# Patient Record
Sex: Female | Born: 2000 | Race: White | Hispanic: No | Marital: Single | Smoking: Never smoker
Health system: Southern US, Community
[De-identification: ages and names within clinical notes are randomized; demographics above are authoritative.]

## PROBLEM LIST (undated history)

## (undated) DIAGNOSIS — R102 Pelvic and perineal pain unspecified side: Secondary | ICD-10-CM

## (undated) DIAGNOSIS — N941 Unspecified dyspareunia: Secondary | ICD-10-CM

## (undated) DIAGNOSIS — N939 Abnormal uterine and vaginal bleeding, unspecified: Secondary | ICD-10-CM

## (undated) DIAGNOSIS — Z1509 Genetic susceptibility to other malignant neoplasm: Secondary | ICD-10-CM

## (undated) DIAGNOSIS — T8069XA Other serum reaction due to other serum, initial encounter: Secondary | ICD-10-CM

## (undated) DIAGNOSIS — F909 Attention-deficit hyperactivity disorder, unspecified type: Secondary | ICD-10-CM

## (undated) HISTORY — DX: Other serum reaction due to other serum, initial encounter: T80.69XA

## (undated) HISTORY — PX: TONSILLECTOMY: SUR1361

## (undated) HISTORY — DX: Attention-deficit hyperactivity disorder, unspecified type: F90.9

## (undated) HISTORY — DX: Genetic susceptibility to other malignant neoplasm: Z15.09

---
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
## 2018-02-10 DIAGNOSIS — S0990XA Unspecified injury of head, initial encounter: Secondary | ICD-10-CM | POA: Diagnosis not present

## 2018-02-10 DIAGNOSIS — R040 Epistaxis: Secondary | ICD-10-CM | POA: Diagnosis not present

## 2018-02-11 DIAGNOSIS — Z9089 Acquired absence of other organs: Secondary | ICD-10-CM | POA: Diagnosis not present

## 2018-02-11 DIAGNOSIS — R040 Epistaxis: Secondary | ICD-10-CM | POA: Diagnosis not present

## 2018-10-12 DIAGNOSIS — R05 Cough: Secondary | ICD-10-CM | POA: Diagnosis not present

## 2018-10-13 DIAGNOSIS — M67432 Ganglion, left wrist: Secondary | ICD-10-CM | POA: Diagnosis not present

## 2018-11-16 DIAGNOSIS — Z01419 Encounter for gynecological examination (general) (routine) without abnormal findings: Secondary | ICD-10-CM | POA: Diagnosis not present

## 2018-11-16 DIAGNOSIS — N920 Excessive and frequent menstruation with regular cycle: Secondary | ICD-10-CM | POA: Diagnosis not present

## 2018-11-16 DIAGNOSIS — Z6820 Body mass index (BMI) 20.0-20.9, adult: Secondary | ICD-10-CM | POA: Diagnosis not present

## 2018-11-16 DIAGNOSIS — N946 Dysmenorrhea, unspecified: Secondary | ICD-10-CM | POA: Diagnosis not present

## 2018-12-09 DIAGNOSIS — R6889 Other general symptoms and signs: Secondary | ICD-10-CM | POA: Diagnosis not present

## 2019-01-10 DIAGNOSIS — Z20828 Contact with and (suspected) exposure to other viral communicable diseases: Secondary | ICD-10-CM | POA: Diagnosis not present

## 2019-01-24 DIAGNOSIS — K2100 Gastro-esophageal reflux disease with esophagitis, without bleeding: Secondary | ICD-10-CM | POA: Diagnosis not present

## 2019-01-24 DIAGNOSIS — R0982 Postnasal drip: Secondary | ICD-10-CM | POA: Diagnosis not present

## 2019-01-26 ENCOUNTER — Other Ambulatory Visit: Payer: Self-pay

## 2019-01-26 ENCOUNTER — Emergency Department (HOSPITAL_COMMUNITY): Payer: BC Managed Care – PPO

## 2019-01-26 ENCOUNTER — Emergency Department (HOSPITAL_COMMUNITY)
Admission: EM | Admit: 2019-01-26 | Discharge: 2019-01-26 | Disposition: A | Discharge status: Home / Self Care | Payer: BC Managed Care – PPO | Attending: Emergency Medicine | Admitting: Emergency Medicine

## 2019-01-26 ENCOUNTER — Encounter (HOSPITAL_COMMUNITY): Payer: Self-pay | Admitting: Emergency Medicine

## 2019-01-26 DIAGNOSIS — R000 Tachycardia, unspecified: Secondary | ICD-10-CM | POA: Diagnosis not present

## 2019-01-26 DIAGNOSIS — M940 Chondrocostal junction syndrome [Tietze]: Secondary | ICD-10-CM | POA: Diagnosis not present

## 2019-01-26 DIAGNOSIS — Z79899 Other long term (current) drug therapy: Secondary | ICD-10-CM | POA: Diagnosis not present

## 2019-01-26 DIAGNOSIS — R079 Chest pain, unspecified: Secondary | ICD-10-CM | POA: Diagnosis not present

## 2019-01-26 LAB — BASIC METABOLIC PANEL
Anion gap: 9 (ref 5–15)
BUN: 9 mg/dL (ref 6–20)
CO2: 22 mmol/L (ref 22–32)
Calcium: 9.3 mg/dL (ref 8.9–10.3)
Chloride: 107 mmol/L (ref 98–111)
Creatinine, Ser: 0.77 mg/dL (ref 0.44–1.00)
GFR calc Af Amer: >60 mL/min (ref >60)
GFR calc non Af Amer: >60 mL/min (ref >60)
Glucose, Bld: 101 mg/dL — ABNORMAL HIGH (ref 70–99)
Potassium: 3.4 mmol/L — ABNORMAL LOW (ref 3.5–5.1)
Sodium: 138 mmol/L (ref 135–145)

## 2019-01-26 LAB — CBC
HCT: 40.7 % (ref 36.0–46.0)
Hemoglobin: 13.3 g/dL (ref 12.0–15.0)
MCH: 29.8 pg (ref 26.0–34.0)
MCHC: 32.7 g/dL (ref 30.0–36.0)
MCV: 91.3 fL (ref 80.0–100.0)
Platelets: 213 10*3/uL (ref 150–400)
RBC: 4.46 MIL/uL (ref 3.87–5.11)
RDW: 12.3 % (ref 11.5–15.5)
WBC: 9.6 10*3/uL (ref 4.0–10.5)
nRBC: 0 % (ref 0.0–0.2)

## 2019-01-26 LAB — I-STAT BETA HCG BLOOD, ED (MC, WL, AP ONLY): I-stat hCG, quantitative: <5 m[IU]/mL (ref <5)

## 2019-01-26 LAB — TROPONIN I (HIGH SENSITIVITY): Troponin I (High Sensitivity): <2 ng/L (ref <18)

## 2019-01-26 LAB — PROTIME-INR
INR: 1.1 (ref 0.8–1.2)
Prothrombin Time: 13.6 seconds (ref 11.4–15.2)

## 2019-01-26 MED ORDER — MONTELUKAST SODIUM 10 MG PO TABS: 10.0000 mg | ORAL_TABLET | Freq: Every day | ORAL | 0 refills | Status: DC

## 2019-01-26 MED ORDER — SODIUM CHLORIDE 0.9% FLUSH: 3.0000 mL | Freq: Once | INTRAVENOUS | Status: DC

## 2019-01-26 NOTE — ED Triage Notes (Signed)
Patient reports persistent central chest pain for 5 days with mild SOB , no cough or fever .

## 2019-01-26 NOTE — ED Provider Notes (Signed)
[facility] EMERGENCY DEPARTMENT Provider Note   CSN: [id number] Arrival date & time: 01/26/19  1859     History   Chief Complaint Chief Complaint  Patient presents with   Chest Pain    HPI Dana Cross is a 18 y.o. female.     18 year old female presents with complaint of chest wall pain x5 days.  Pain is located through her sternum and left and right lower ribs, worse with palpation and coughing.  Patient states that she has been coughing for the past 5 or so days, went to her PCP and was put on a PPI without improvement in her cough.  Patient followed up with ENT who tried a nasal spray which resulted in nasal congestion and no improvement in the cough.  Patient states cough is worse at night, not associated with fever or chills.  Patient has had a negative Covid test recently.  Patient also reports drinking alcohol 4 nights ago followed by multiple episodes of vomiting, unsure if this is related to her pain today.  Patient vapes, does not smoke cigarettes, no hormonal OCP use, no personal or family history of PE or DVT.  No other complaints or concerns.     Past Medical History:  Diagnosis Date   ADHD (attention deficit hyperactivity disorder)    Serum sickness     Patient Active Problem List   Diagnosis Date Noted   Serum sickness to cefdinir 02/24/2011   Tonsillar hypertrophy 02/24/2011   ADHD (attention deficit hyperactivity disorder) 01/17/2011    History reviewed. No pertinent surgical history.   OB History   No obstetric history on file.      Home Medications    Prior to Admission medications   Medication Sig Start Date End Date Taking? Authorizing Provider  amphetamine-dextroamphetamine (ADDERALL XR) 10 MG 24 hr capsule Take 1 capsule (10 mg total) by mouth every morning. 11/04/10 12/04/10  Young, Madaline Brilliant A, MD  amphetamine-dextroamphetamine (ADDERALL XR) 10 MG 24 hr capsule Take 1 capsule (10 mg total) by mouth every morning. 12/03/11    Young, Madaline Brilliant A, MD  amphetamine-dextroamphetamine (ADDERALL XR, 10MG ,) 10 MG 24 hr capsule Take 1 capsule (10 mg total) by mouth every morning. 09/19/10 10/19/10  10/21/10 A, MD  erythromycin ophthalmic ointment Apply as directed QID for 7 days or until resolved 07/02/11   07/04/11, NP  montelukast (SINGULAIR) 10 MG tablet Take 1 tablet (10 mg total) by mouth at bedtime. 01/26/19   03/28/19, PA-C    Family History No family history on file.  Social History Social History   Tobacco Use   Smoking status: Never Smoker   Smokeless tobacco: Never Used  Substance Use Topics   Alcohol use: Yes    Frequency: Never   Drug use: Never     Allergies   Cefdinir   Review of Systems Review of Systems  Constitutional: Negative for chills, diaphoresis and fever.  HENT: Positive for congestion and postnasal drip.   Respiratory: Positive for cough. Negative for chest tightness and shortness of breath.   Cardiovascular: Positive for chest pain. Negative for palpitations and leg swelling.  Gastrointestinal: Negative for abdominal pain.  Musculoskeletal: Negative for arthralgias and myalgias.  Skin: Negative for rash and wound.  Allergic/Immunologic: Negative for immunocompromised state.  Neurological: Negative for weakness.  Hematological: Negative for adenopathy.  Psychiatric/Behavioral: Negative for confusion.  All other systems reviewed and are negative.    Physical Exam Updated Vital Signs BP 112/81  Pulse 96    Temp 98.6 F (37 C) (Oral)    Resp 16    LMP 12/25/2018 (Approximate)    SpO2 100%   Physical Exam Vitals signs and nursing note reviewed.  Constitutional:      General: She is not in acute distress.    Appearance: She is well-developed. She is not diaphoretic.  HENT:     Head: Normocephalic and atraumatic.  Neck:     Musculoskeletal: Neck supple.  Cardiovascular:     Rate and Rhythm: Normal rate and regular rhythm.     Heart sounds: Normal  heart sounds. No murmur.  Pulmonary:     Effort: Pulmonary effort is normal.     Breath sounds: Normal breath sounds. No decreased breath sounds.  Chest:     Chest wall: Tenderness present. No deformity or crepitus.    Abdominal:     Palpations: Abdomen is soft.     Tenderness: There is no abdominal tenderness.  Musculoskeletal:     Right lower leg: No edema.     Left lower leg: No edema.  Neurological:     Mental Status: She is alert and oriented to person, place, and time.  Psychiatric:        Behavior: Behavior normal.      ED Treatments / Results  Labs (all labs ordered are listed, but only abnormal results are displayed) Labs Reviewed  BASIC METABOLIC PANEL - Abnormal; Notable for the following components:      Result Value   Potassium 3.4 (*)    Glucose, Bld 101 (*)    All other components within normal limits  CBC  PROTIME-INR  I-STAT BETA HCG BLOOD, ED ([facility], [facility], [facility] ONLY)  TROPONIN I (HIGH SENSITIVITY)    EKG EKG Interpretation  Date/Time:  Wednesday January 26 2019 19:11:15 EDT Ventricular Rate:  101 PR Interval:  138 QRS Duration: 78 QT Interval:  338 QTC Calculation: 438 R Axis:   49 Text Interpretation:  Sinus tachycardia T wave abnormality, consider inferior ischemia Abnormal ECG No previous ECGs available Confirmed by Richardean CanalYao, David H [phone]([id number]) on 01/26/2019 8:30:38 PM   Radiology Dg Chest 2 View  Result Date: 01/26/2019 CLINICAL DATA:  Chest pain EXAM: CHEST - 2 VIEW COMPARISON:  None. FINDINGS: The heart size and mediastinal contours are within normal limits. Both lungs are clear. The visualized skeletal structures are unremarkable. IMPRESSION: No acute cardiopulmonary process. Electronically Signed   By: Jonna ClarkBindu  Avutu M.D.   On: 01/26/2019 20:02    Procedures Procedures (including critical care time)  Medications Ordered in ED Medications  sodium chloride flush (NS) 0.9 % injection 3 mL (has no administration in time range)     Initial  Impression / Assessment and Plan / ED Course  I have reviewed the triage vital signs and the nursing notes.  Pertinent labs & imaging results that were available during my care of the patient were reviewed by me and considered in my medical decision making (see chart for details).  Clinical Course as of Jan 25 2099  Wed Jan 26, 2019  [id number] 18 year old female presents with complaint of chest pain with palpation through the area of her sternum, occasionally in the area of her left and right lower ribs.  No fevers, no specific recent illness however does report frequent cough which has been treated recently with a PPI and nasal spray without improvement.  On exam patient has tenderness across her sternum, palpation reproduces her pain.  No crepitus, no rashes,  no ecchymosis.  Abdomen is soft and nontender.  EKG reviewed with Dr. Darl Householder, ER attending, unremarkable.  Troponin is negative, hCG negative, INR within normal limits, CBC and BMP without significant findings.  Chest x-ray is clear.  PERC negative Discussed likely costochondritis, recommend treatment with NSAID therapy, given further instructions on cold and warm compresses and recommend follow-up with PCP.  Regarding patient's cough, discussed tic versus asthma causing nighttime cough.  No history of asthma, patient will try Singulair as she has had no relief with her nasal spray and PPI.  Patient to continue on PPI throughout her course of NSAID therapy for costochondritis and follow-up with PCP.   [LM]    Clinical Course User Index [LM] Tacy Learn, PA-C      Final Clinical Impressions(s) / ED Diagnoses   Final diagnoses:  Costochondritis, acute    ED Discharge Orders         Ordered    montelukast (SINGULAIR) 10 MG tablet  Daily at bedtime     01/26/19 2034           Tacy Learn, PA-C 01/26/19 2100    Drenda Freeze, MD 01/26/19 2342

## 2019-01-26 NOTE — Discharge Instructions (Addendum)
Take 600mg  Motrin every 8 hours for 10 days. Take Singulair at bedtime. Discontinue if no improvement in your cough after 1 week. Follow up with your primary care provider.

## 2019-01-26 NOTE — ED Notes (Signed)
Discharge instructions discussed with pt. Pt verbalized understanding. Pt stable and ambulatory. No signature pad available. 

## 2019-02-14 DIAGNOSIS — Z20828 Contact with and (suspected) exposure to other viral communicable diseases: Secondary | ICD-10-CM | POA: Diagnosis not present

## 2019-02-17 ENCOUNTER — Other Ambulatory Visit: Payer: Self-pay | Admitting: Gastroenterology

## 2019-02-17 DIAGNOSIS — R079 Chest pain, unspecified: Secondary | ICD-10-CM

## 2019-02-17 DIAGNOSIS — R109 Unspecified abdominal pain: Secondary | ICD-10-CM

## 2019-02-17 DIAGNOSIS — R0789 Other chest pain: Secondary | ICD-10-CM | POA: Diagnosis not present

## 2019-02-17 DIAGNOSIS — K2100 Gastro-esophageal reflux disease with esophagitis, without bleeding: Secondary | ICD-10-CM | POA: Diagnosis not present

## 2019-02-25 ENCOUNTER — Other Ambulatory Visit: Payer: BC Managed Care – PPO

## 2019-03-01 ENCOUNTER — Ambulatory Visit
Admission: RE | Admit: 2019-03-01 | Discharge: 2019-03-01 | Disposition: A | Discharge status: Home / Self Care | Payer: BC Managed Care – PPO | Source: Ambulatory Visit | Attending: Gastroenterology | Admitting: Gastroenterology

## 2019-03-01 DIAGNOSIS — R109 Unspecified abdominal pain: Secondary | ICD-10-CM

## 2019-03-01 DIAGNOSIS — R079 Chest pain, unspecified: Secondary | ICD-10-CM | POA: Diagnosis not present

## 2019-03-25 DIAGNOSIS — L298 Other pruritus: Secondary | ICD-10-CM | POA: Diagnosis not present

## 2019-03-25 DIAGNOSIS — N390 Urinary tract infection, site not specified: Secondary | ICD-10-CM | POA: Diagnosis not present

## 2019-04-16 DIAGNOSIS — Z03818 Encounter for observation for suspected exposure to other biological agents ruled out: Secondary | ICD-10-CM | POA: Diagnosis not present

## 2019-04-16 DIAGNOSIS — R05 Cough: Secondary | ICD-10-CM | POA: Diagnosis not present

## 2019-04-16 DIAGNOSIS — R5383 Other fatigue: Secondary | ICD-10-CM | POA: Diagnosis not present

## 2019-04-17 DIAGNOSIS — Z03818 Encounter for observation for suspected exposure to other biological agents ruled out: Secondary | ICD-10-CM | POA: Diagnosis not present

## 2019-06-14 DIAGNOSIS — Z20828 Contact with and (suspected) exposure to other viral communicable diseases: Secondary | ICD-10-CM | POA: Diagnosis not present

## 2019-07-30 DIAGNOSIS — R05 Cough: Secondary | ICD-10-CM | POA: Diagnosis not present

## 2019-09-01 DIAGNOSIS — N644 Mastodynia: Secondary | ICD-10-CM | POA: Diagnosis not present

## 2019-09-15 DIAGNOSIS — N644 Mastodynia: Secondary | ICD-10-CM | POA: Diagnosis not present

## 2019-11-17 DIAGNOSIS — Z0000 Encounter for general adult medical examination without abnormal findings: Secondary | ICD-10-CM | POA: Diagnosis not present

## 2019-11-17 DIAGNOSIS — N920 Excessive and frequent menstruation with regular cycle: Secondary | ICD-10-CM | POA: Diagnosis not present

## 2019-11-17 DIAGNOSIS — N644 Mastodynia: Secondary | ICD-10-CM | POA: Diagnosis not present

## 2019-11-17 DIAGNOSIS — Z124 Encounter for screening for malignant neoplasm of cervix: Secondary | ICD-10-CM | POA: Diagnosis not present

## 2020-01-03 DIAGNOSIS — S93491A Sprain of other ligament of right ankle, initial encounter: Secondary | ICD-10-CM | POA: Diagnosis not present

## 2020-01-23 DIAGNOSIS — R509 Fever, unspecified: Secondary | ICD-10-CM | POA: Diagnosis not present

## 2020-01-24 DIAGNOSIS — J029 Acute pharyngitis, unspecified: Secondary | ICD-10-CM | POA: Diagnosis not present

## 2020-02-01 DIAGNOSIS — J069 Acute upper respiratory infection, unspecified: Secondary | ICD-10-CM | POA: Diagnosis not present

## 2020-05-22 DIAGNOSIS — M67432 Ganglion, left wrist: Secondary | ICD-10-CM | POA: Diagnosis not present

## 2020-06-01 DIAGNOSIS — M67432 Ganglion, left wrist: Secondary | ICD-10-CM | POA: Diagnosis not present

## 2020-06-01 DIAGNOSIS — G8918 Other acute postprocedural pain: Secondary | ICD-10-CM | POA: Diagnosis not present

## 2020-06-14 DIAGNOSIS — M67432 Ganglion, left wrist: Secondary | ICD-10-CM | POA: Diagnosis not present

## 2020-09-19 ENCOUNTER — Ambulatory Visit (HOSPITAL_COMMUNITY)
Admission: RE | Admit: 2020-09-19 | Discharge: 2020-09-19 | Disposition: A | Discharge status: Home / Self Care | Payer: 59 | Source: Ambulatory Visit | Attending: Internal Medicine | Admitting: Internal Medicine

## 2020-09-19 ENCOUNTER — Other Ambulatory Visit: Payer: Self-pay

## 2020-09-19 ENCOUNTER — Encounter (HOSPITAL_COMMUNITY): Payer: Self-pay

## 2020-09-19 VITALS — BP 109/70 | HR 89 | Temp 98.2°F | Resp 16

## 2020-09-19 DIAGNOSIS — B373 Candidiasis of vulva and vagina: Secondary | ICD-10-CM | POA: Diagnosis not present

## 2020-09-19 DIAGNOSIS — B3731 Acute candidiasis of vulva and vagina: Secondary | ICD-10-CM

## 2020-09-19 LAB — POCT URINALYSIS DIPSTICK, ED / UC
Bilirubin Urine: NEGATIVE
Glucose, UA: NEGATIVE mg/dL
Ketones, ur: NEGATIVE mg/dL
Leukocytes,Ua: NEGATIVE
Nitrite: NEGATIVE
Protein, ur: NEGATIVE mg/dL
Specific Gravity, Urine: 1.015 (ref 1.005–1.030)
Urobilinogen, UA: 0.2 mg/dL (ref 0.0–1.0)
pH: 6 (ref 5.0–8.0)

## 2020-09-19 LAB — POC URINE PREG, ED: Preg Test, Ur: NEGATIVE

## 2020-09-19 MED ORDER — FLUCONAZOLE 150 MG PO TABS: 150.0000 mg | ORAL_TABLET | Freq: Every day | ORAL | 0 refills | Status: DC

## 2020-09-19 NOTE — Discharge Instructions (Signed)
Your urine did not show any signs of infection.    Take one Diflucan today and repeat in 3 days if you are still having symptoms.   Try to stay clean and dry.  Avoid tight fitting clothing or synthetic fibers that do not allow your skin to breathe.    Return or go to the Emergency Department if symptoms worsen or do not improve in the next few days.

## 2020-09-19 NOTE — ED Triage Notes (Signed)
Vaginal pain and itching for 4 days.  Labia swelling yesterday that was very uncomfortable.

## 2020-09-19 NOTE — ED Provider Notes (Signed)
[facility]    CSN: [id number] Arrival date & time: 09/19/20  1726      History   Chief Complaint Chief Complaint  Patient presents with  . Vaginal Itching    HPI Dana Cross is a 20 y.o. female.   Patient here for evaluation of vaginal itching and discharge for the past few days, worse yesterday.  Reports having some right labial swelling and burning with urination yesterday.  Reports having some thick white discharge.  Denies any odor.  Denies any recent sexual contact and no concerns about STIs at this time.  Denies any trauma, injury, or other precipitating event.  Denies any specific alleviating or aggravating factors.  Denies any fevers, chest pain, shortness of breath, N/V/D, numbness, tingling, weakness, abdominal pain, or headaches.    The history is provided by the patient.  Vaginal Itching    Past Medical History:  Diagnosis Date  . ADHD (attention deficit hyperactivity disorder)   . Serum sickness     Patient Active Problem List   Diagnosis Date Noted  . Serum sickness to cefdinir 02/24/2011  . Tonsillar hypertrophy 02/24/2011  . ADHD (attention deficit hyperactivity disorder) 01/17/2011    History reviewed. No pertinent surgical history.  OB History   No obstetric history on file.      Home Medications    Prior to Admission medications   Medication Sig Start Date End Date Taking? Authorizing Provider  fluconazole (DIFLUCAN) 150 MG tablet Take 1 tablet (150 mg total) by mouth daily. Take one tablet now and one in 3 days if you are still having symptoms 09/19/20  Yes Ivette Loyal, NP  amphetamine-dextroamphetamine (ADDERALL XR) 10 MG 24 hr capsule Take 1 capsule (10 mg total) by mouth every morning. 11/04/10 12/04/10  Young, Madaline Brilliant A, MD  amphetamine-dextroamphetamine (ADDERALL XR) 10 MG 24 hr capsule Take 1 capsule (10 mg total) by mouth every morning. 12/03/11   Young, Madaline Brilliant A, MD  amphetamine-dextroamphetamine (ADDERALL XR, 10MG ,) 10 MG 24  hr capsule Take 1 capsule (10 mg total) by mouth every morning. 09/19/10 10/19/10  10/21/10 A, MD  erythromycin ophthalmic ointment Apply as directed QID for 7 days or until resolved 07/02/11   07/04/11, NP  montelukast (SINGULAIR) 10 MG tablet Take 1 tablet (10 mg total) by mouth at bedtime. 01/26/19   03/28/19, PA-C    Family History No family history on file.  Social History Social History   Tobacco Use  . Smoking status: Never Smoker  . Smokeless tobacco: Never Used  Substance Use Topics  . Alcohol use: Yes  . Drug use: Never     Allergies   Cefdinir   Review of Systems Review of Systems  Genitourinary: Positive for vaginal discharge and vaginal pain.  All other systems reviewed and are negative.    Physical Exam Triage Vital Signs ED Triage Vitals  Enc Vitals Group     BP 09/19/20 1749 109/70     Pulse Rate 09/19/20 1749 89     Resp 09/19/20 1749 16     Temp 09/19/20 1749 98.2 F (36.8 C)     Temp Source 09/19/20 1749 Oral     SpO2 09/19/20 1749 100 %     Weight --      Height --      Head Circumference --      Peak Flow --      Pain Score 09/19/20 1747 5     Pain Loc --  Pain Edu? --      Excl. in GC? --    No data found.  Updated Vital Signs BP 109/70   Pulse 89   Temp 98.2 F (36.8 C) (Oral)   Resp 16   LMP 08/24/2020   SpO2 100%   Visual Acuity Right Eye Distance:   Left Eye Distance:   Bilateral Distance:    Right Eye Near:   Left Eye Near:    Bilateral Near:     Physical Exam Vitals and nursing note reviewed.  Constitutional:      General: She is not in acute distress.    Appearance: Normal appearance. She is not ill-appearing, toxic-appearing or diaphoretic.  HENT:     Head: Normocephalic and atraumatic.  Eyes:     Conjunctiva/sclera: Conjunctivae normal.  Cardiovascular:     Rate and Rhythm: Normal rate.     Pulses: Normal pulses.  Pulmonary:     Effort: Pulmonary effort is normal.  Abdominal:      General: Abdomen is flat.  Genitourinary:    Comments: declines Musculoskeletal:        General: Normal range of motion.     Cervical back: Normal range of motion.  Skin:    General: Skin is warm and dry.  Neurological:     General: No focal deficit present.     Mental Status: She is alert and oriented to person, place, and time.  Psychiatric:        Mood and Affect: Mood normal.      UC Treatments / Results  Labs (all labs ordered are listed, but only abnormal results are displayed) Labs Reviewed  POCT URINALYSIS DIPSTICK, ED / UC - Abnormal; Notable for the following components:      Result Value   Hgb urine dipstick TRACE (*)    All other components within normal limits  POC URINE PREG, ED    EKG   Radiology No results found.  Procedures Procedures (including critical care time)  Medications Ordered in UC Medications - No data to display  Initial Impression / Assessment and Plan / UC Course  I have reviewed the triage vital signs and the nursing notes.  Pertinent labs & imaging results that were available during my care of the patient were reviewed by me and considered in my medical decision making (see chart for details).    Assessment negative for red flags or concerns.  Likely yeast vaginitis.  Urinalysis with no signs of urinary tract infection.  Will treat with diflucan one dose today and repeat in 3 days if still symptomatic.   Try to stay clean and dry and avoid tight fitting clothing and fabrics that do not breathe.  Follow up with primary care as needed.  Final Clinical Impressions(s) / UC Diagnoses   Final diagnoses:  Yeast vaginitis     Discharge Instructions     Your urine did not show any signs of infection.    Take one Diflucan today and repeat in 3 days if you are still having symptoms.   Try to stay clean and dry.  Avoid tight fitting clothing or synthetic fibers that do not allow your skin to breathe.    Return or go to the Emergency  Department if symptoms worsen or do not improve in the next few days.     ED Prescriptions    Medication Sig Dispense Auth. Provider   fluconazole (DIFLUCAN) 150 MG tablet Take 1 tablet (150 mg total) by mouth daily. Take  one tablet now and one in 3 days if you are still having symptoms 2 tablet Ivette Loyal, NP     PDMP not reviewed this encounter.   Ivette Loyal, NP 09/19/20 [phone]

## 2020-10-18 DIAGNOSIS — J209 Acute bronchitis, unspecified: Secondary | ICD-10-CM | POA: Diagnosis not present

## 2021-01-08 DIAGNOSIS — R109 Unspecified abdominal pain: Secondary | ICD-10-CM | POA: Diagnosis not present

## 2021-01-08 DIAGNOSIS — J029 Acute pharyngitis, unspecified: Secondary | ICD-10-CM | POA: Diagnosis not present

## 2021-01-08 DIAGNOSIS — Z20822 Contact with and (suspected) exposure to covid-19: Secondary | ICD-10-CM | POA: Diagnosis not present

## 2021-01-08 DIAGNOSIS — R112 Nausea with vomiting, unspecified: Secondary | ICD-10-CM | POA: Diagnosis not present

## 2021-06-17 IMAGING — RF DG UGI W/ HIGH DENSITY W/O KUB
9 of 10 series · 14 of 24 positions shown · non-contrast
Comparison: No comparison studies available.

CLINICAL DATA: Chest pain.  History of reflux.

EXAM:
UPPER GI SERIES WITH KUB
TECHNIQUE: After obtaining a scout radiograph a routine upper GI series was
performed using thin and high density barium.
FLUOROSCOPY TIME:  Fluoroscopy Time:  2 minutes and 24 seconds.
Radiation Exposure Index (if provided by the fluoroscopic device):
114 mGy
Number of Acquired Spot Images:

[Series 1: one shot · 0.14mm/px · 1 of 1 slices shown (1 of 3)]
[im 1/1]
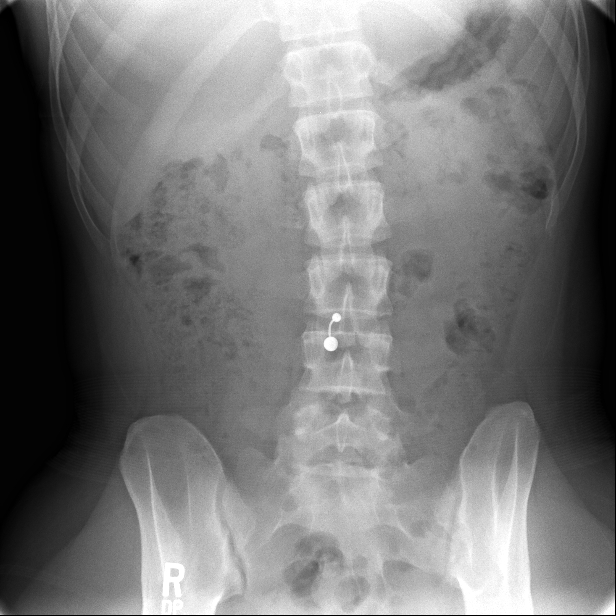

[Series 2: sequence · 0.29mm/px · 1 of 11 frames shown (1 of 6)]
[frame 6/11]
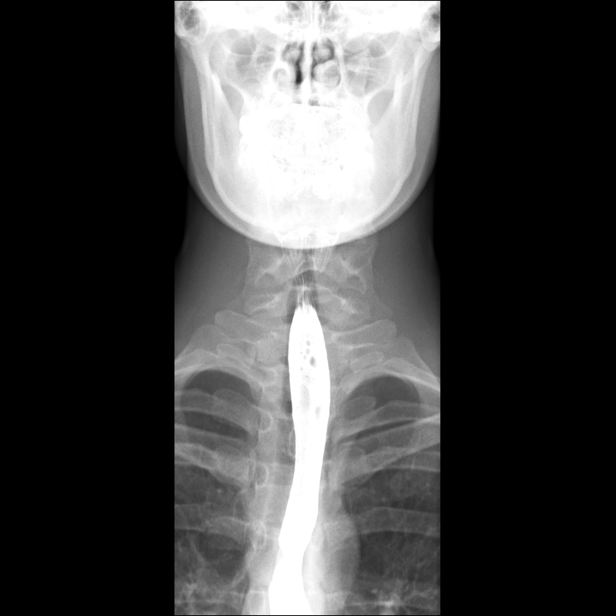

[Series 3: sequence · 0.29mm/px · 1 of 9 frames shown (2 of 6)]
[frame 6/9]
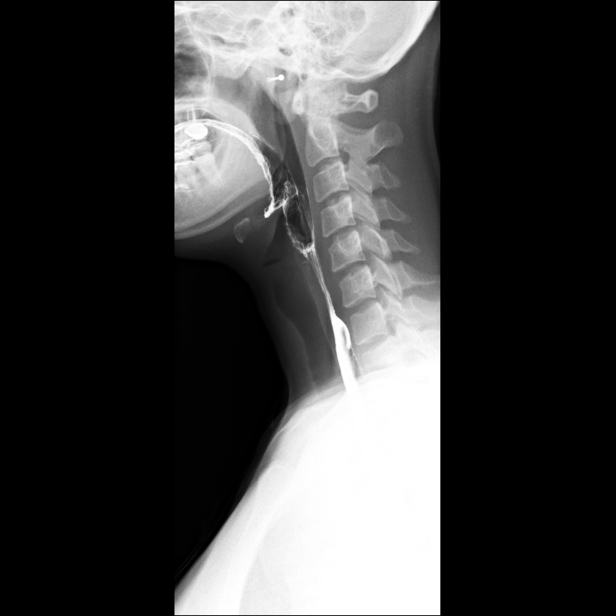

[Series 4: sequence · 0.29mm/px · 2 of 11 frames shown (3 of 6)]
[frame 5/11]
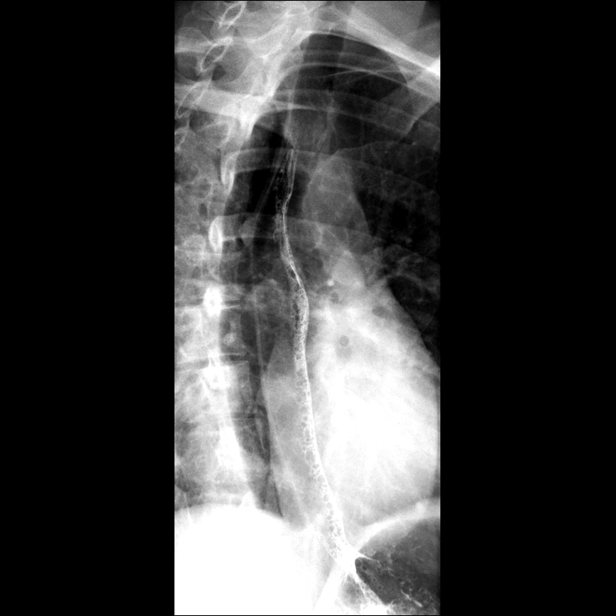
[frame 10/11]
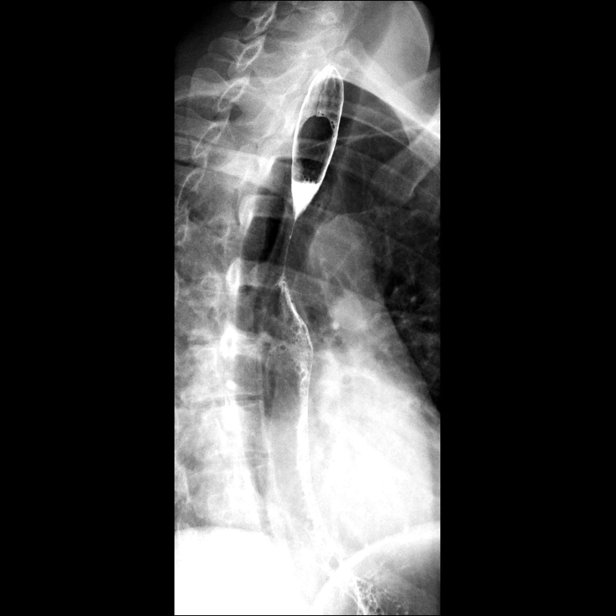

[Series 5: sequence · 0.29mm/px · 1 of 21 frames shown (4 of 6)]
[frame 18/21]
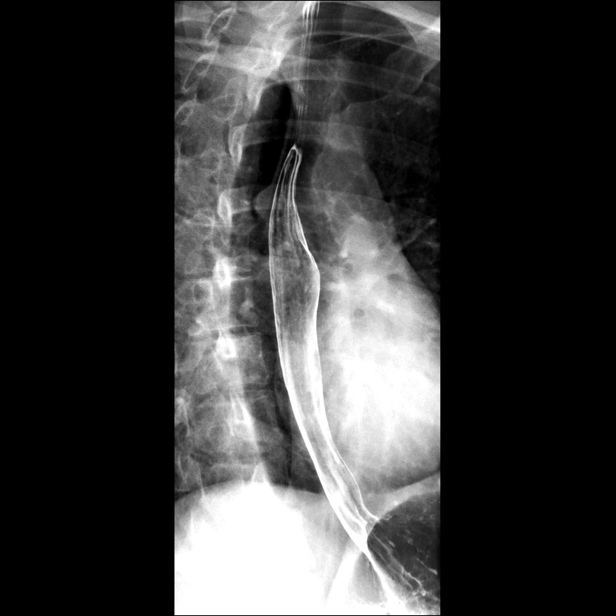

[Series 7: one shot · 0.15mm/px · 5 of 14 slices shown (2 of 3)]
[im 2/14]
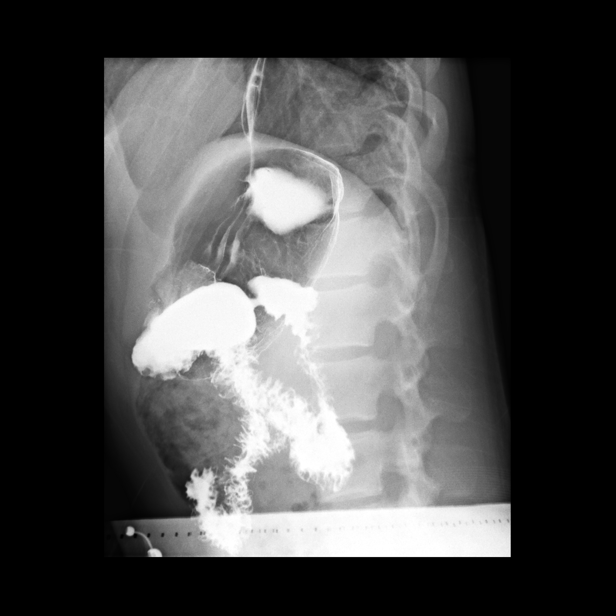
[im 4/14]
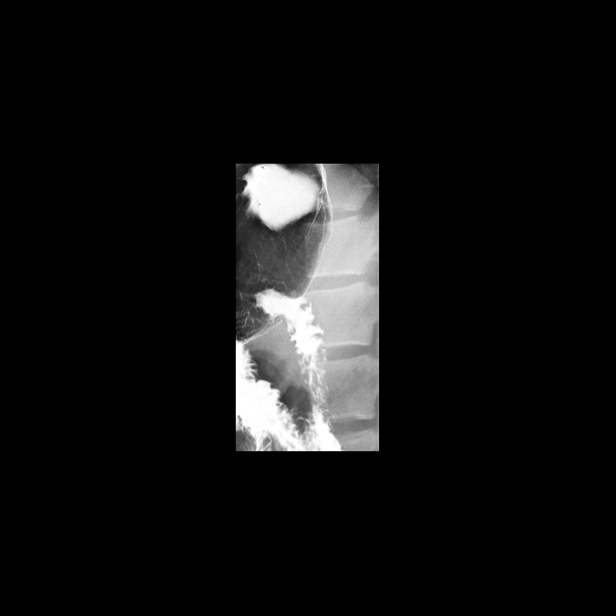
[im 7/14]
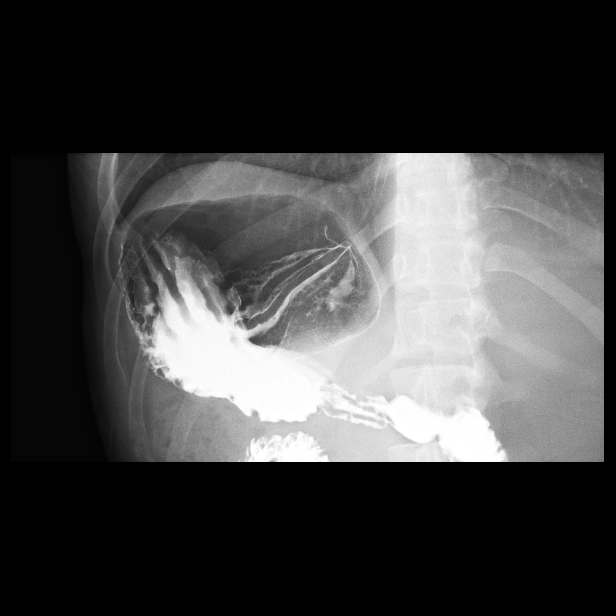
[im 11/14]
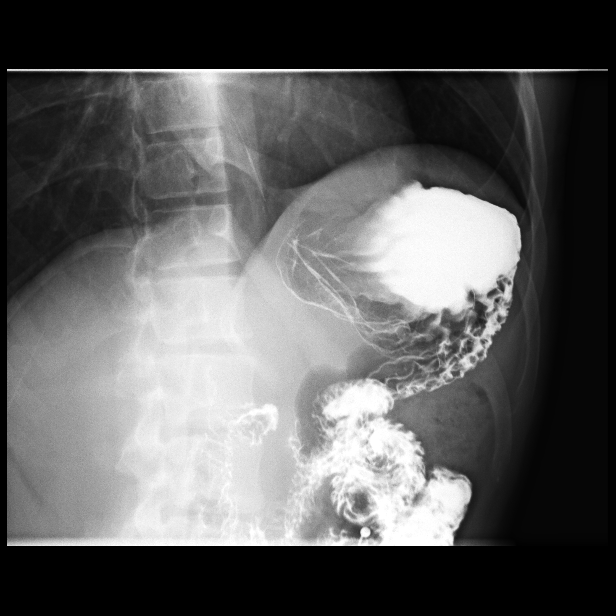
[im 14/14]
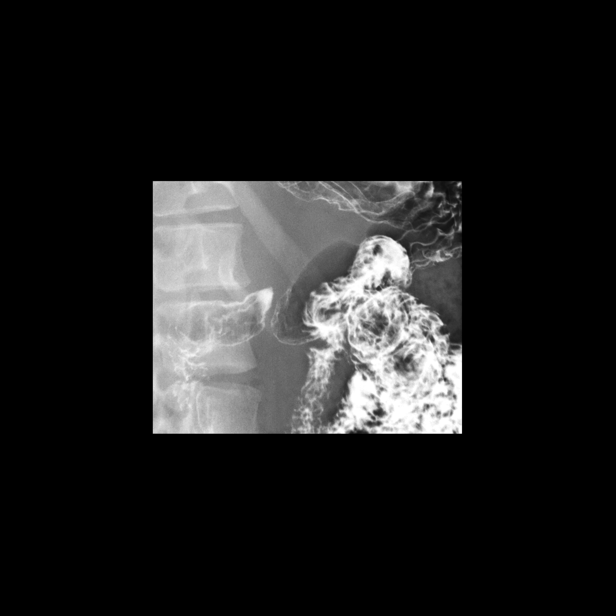

[Series 8: sequence · 1 of 65 frames shown (5 of 6)]
[frame 20/65]
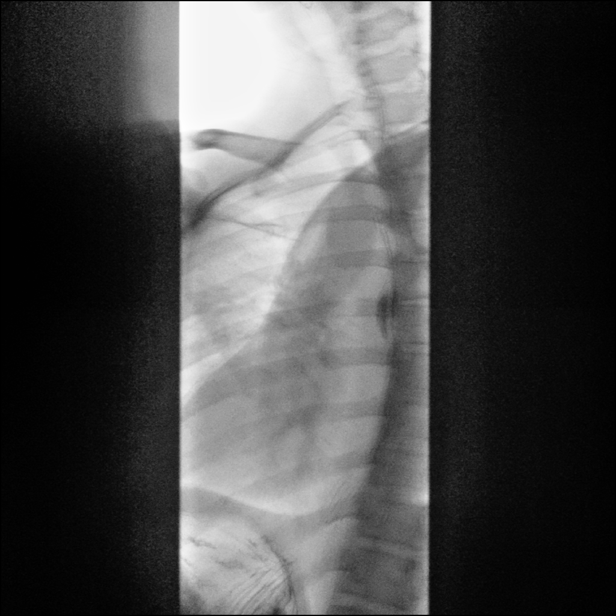

[Series 9: sequence · 1 of 65 frames shown (6 of 6)]
[frame 10/65]
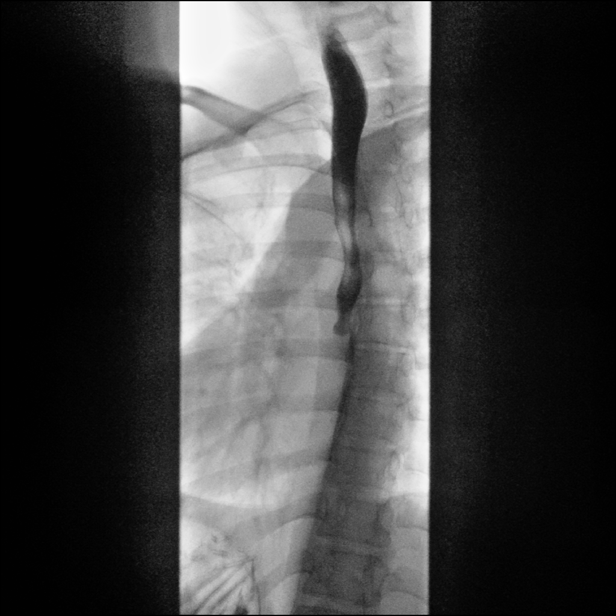

[Series 10: one shot · 1 of 1 slices shown (3 of 3)]
[im 1/1]
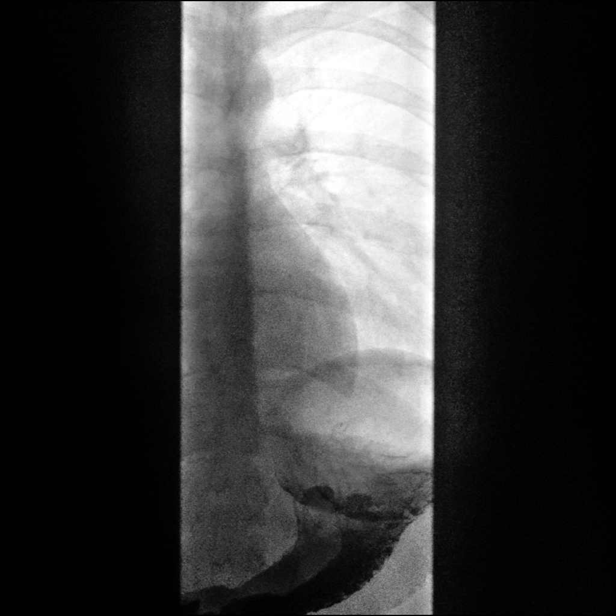

[14 of 24 positions shown; findings below may reference images not displayed]

FINDINGS: Pre-procedure KUB shows a normal bowel gas pattern.

Frontal and lateral views of the hypopharynx while swallowing are
normal. Double contrast imaging of the esophagus shows no evidence
for esophageal diverticulum, mass lesion, mucosal ulceration, or
stricture. Esophageal motility is normal. 13 mm barium tablet passes
readily into the stomach when taken with water.

Double contrast imaging of the stomach shows no evidence for mass
lesion, gross mucosal ulceration, or stricture. Gastric fold
thickness is somewhat prominent in the mid stomach, but folds are
smooth without nodularity. As mid stomach is not well distended, the
mucosal fold prominence is felt to be related to underdistention.
Gastric emptying is prompt. The pylorus is normal. Single and double
contrast views of the duodenal bulb are normal. Duodenal C-loop and
ligament of Treitz are normally positioned.
IMPRESSION: Normal double contrast upper GI series.

## 2021-11-29 DIAGNOSIS — J0140 Acute pansinusitis, unspecified: Secondary | ICD-10-CM | POA: Diagnosis not present

## 2021-11-29 DIAGNOSIS — Z681 Body mass index (BMI) 19 or less, adult: Secondary | ICD-10-CM | POA: Diagnosis not present

## 2023-02-16 ENCOUNTER — Ambulatory Visit: Payer: Self-pay | Admitting: *Deleted

## 2023-02-16 ENCOUNTER — Emergency Department (HOSPITAL_BASED_OUTPATIENT_CLINIC_OR_DEPARTMENT_OTHER)
Admission: EM | Admit: 2023-02-16 | Discharge: 2023-02-16 | Disposition: A | Discharge status: Home / Self Care | Payer: BC Managed Care – PPO | Attending: Emergency Medicine | Admitting: Emergency Medicine

## 2023-02-16 ENCOUNTER — Emergency Department (HOSPITAL_BASED_OUTPATIENT_CLINIC_OR_DEPARTMENT_OTHER): Payer: BC Managed Care – PPO | Admitting: Radiology

## 2023-02-16 ENCOUNTER — Encounter (HOSPITAL_BASED_OUTPATIENT_CLINIC_OR_DEPARTMENT_OTHER): Payer: Self-pay

## 2023-02-16 ENCOUNTER — Other Ambulatory Visit: Payer: Self-pay

## 2023-02-16 DIAGNOSIS — R0789 Other chest pain: Secondary | ICD-10-CM | POA: Diagnosis not present

## 2023-02-16 DIAGNOSIS — R079 Chest pain, unspecified: Secondary | ICD-10-CM | POA: Diagnosis present

## 2023-02-16 LAB — CBC
HCT: 42.3 % (ref 36.0–46.0)
Hemoglobin: 14.1 g/dL (ref 12.0–15.0)
MCH: 30.2 pg (ref 26.0–34.0)
MCHC: 33.3 g/dL (ref 30.0–36.0)
MCV: 90.6 fL (ref 80.0–100.0)
Platelets: 217 10*3/uL (ref 150–400)
RBC: 4.67 MIL/uL (ref 3.87–5.11)
RDW: 12.6 % (ref 11.5–15.5)
WBC: 5.9 10*3/uL (ref 4.0–10.5)
nRBC: 0 % (ref 0.0–0.2)

## 2023-02-16 LAB — BASIC METABOLIC PANEL
Anion gap: 8 (ref 5–15)
BUN: 8 mg/dL (ref 6–20)
CO2: 26 mmol/L (ref 22–32)
Calcium: 10.3 mg/dL (ref 8.9–10.3)
Chloride: 106 mmol/L (ref 98–111)
Creatinine, Ser: 0.68 mg/dL (ref 0.44–1.00)
GFR, Estimated: >60 mL/min (ref >60)
Glucose, Bld: 106 mg/dL — ABNORMAL HIGH (ref 70–99)
Potassium: 4.1 mmol/L (ref 3.5–5.1)
Sodium: 140 mmol/L (ref 135–145)

## 2023-02-16 LAB — TROPONIN I (HIGH SENSITIVITY): Troponin I (High Sensitivity): <2 ng/L (ref <18)

## 2023-02-16 LAB — PREGNANCY, URINE: Preg Test, Ur: NEGATIVE

## 2023-02-16 LAB — D-DIMER, QUANTITATIVE: D-Dimer, Quant: 0.27 ug{FEU}/mL (ref 0.00–0.50)

## 2023-02-16 MED ORDER — ALUM & MAG HYDROXIDE-SIMETH 200-200-20 MG/5ML PO SUSP
30.0000 mL | Freq: Once | ORAL | Status: AC
  Administered 2023-02-16: 30 mL via ORAL
  Filled 2023-02-16: qty 30

## 2023-02-16 NOTE — ED Notes (Signed)
Discharge instructions reviewed with patient. Patient questions answered and opportunity for education reviewed. Patient voices understanding of discharge instructions with no further questions. Patient ambulatory with steady gait to lobby.  

## 2023-02-16 NOTE — ED Triage Notes (Signed)
Pt c/o chest pain that started yesterday (sternal area) States she can not take a deep breath. No radiation +nausea

## 2023-02-16 NOTE — ED Notes (Signed)
Spoke with lab to add on dimer

## 2023-02-16 NOTE — Telephone Encounter (Signed)
  Chief Complaint: chest pain middle chest and when taking deep breath in Symptoms: HR 53 bpm per Apple watch, chest pain middle chest break out into sweat when taking deep breath in . Hx anxiety  Frequency: 2 days  Pertinent Negatives: Patient denies difficulty breathing now no severe chest pain. No dizziness  Disposition: [x] ED /[] Urgent Care (no appt availability in office) / [] Appointment(In office/virtual)/ []  [facility]/ [] Home Care/ [] Refused Recommended Disposition /[] [facility]/ []  Follow-up with PCP Additional Notes:   Recommended ED and someone else to drive. If sx worsen call 911.     Reason for Disposition  Taking a deep breath makes pain worse  Answer Assessment - Initial Assessment Questions 1. LOCATION: "Where does it hurt?"       Middle of chest  2. RADIATION: "Does the pain go anywhere else?" (e.g., into neck, jaw, arms, back)     Middle chest  3. ONSET: "When did the chest pain begin?" (Minutes, hours or days)      2 days ago  4. PATTERN: "Does the pain come and go, or has it been constant since it started?"  "Does it get worse with exertion?"      Constant pain  5. DURATION: "How long does it last" (e.g., seconds, minutes, hours)     On going and worse with taking deep breath in  6. SEVERITY: "How bad is the pain?"  (e.g., Scale 1-10; mild, moderate, or severe)    - MILD (1-3): doesn't interfere with normal activities     - MODERATE (4-7): interferes with normal activities or awakens from sleep    - SEVERE (8-10): excruciating pain, unable to do any normal activities       Not severe has been on going x 2 days  7. CARDIAC RISK FACTORS: "Do you have any history of heart problems or risk factors for heart disease?" (e.g., angina, prior heart attack; diabetes, high blood pressure, high cholesterol, smoker, or strong family history of heart disease)     na 8. PULMONARY RISK FACTORS: "Do you have any history of lung disease?"  (e.g., blood  clots in lung, asthma, emphysema, birth control pills)     na 9. CAUSE: "What do you think is causing the chest pain?"     Not sure has had chest pain in the past  10. OTHER SYMPTOMS: "Do you have any other symptoms?" (e.g., dizziness, nausea, vomiting, sweating, fever, difficulty breathing, cough)       Chest pain middle chest, pain when taking deep breath, heart in the 45-53 range today . Breaks out into sweat taking deep breath  11. PREGNANCY: "Is there any chance you are pregnant?" "When was your last menstrual period?"       na  Protocols used: Chest Pain-A-AH

## 2023-02-16 NOTE — Discharge Instructions (Addendum)
You presented to the ED with chest pain and tightness.  An EKG to evaluate your heart did not show any concerning findings.  Blood work also did not show any concerning findings.  A D-dimer test which assesses for blood clot was negative.  A troponin test which tests for heart attack was also negative.  Pregnancy test was negative.  Your chest x-ray did not show any acute findings.  Your vitals remained within normal limits throughout your visit.  Should you experience any new or worsening symptoms such as increased shortness of breath, chest pain, nausea, vomiting, dizziness you should return to the ED.  I would encourage you to follow-up with your PCP in in 1 week for further evaluation.

## 2023-02-16 NOTE — ED Provider Notes (Signed)
[place] EMERGENCY DEPARTMENT AT [facility] Provider Note   CSN: [id number] Arrival date & time: 02/16/23  1624     History {Add pertinent medical, surgical, social history, OB history to HPI:1} Chief Complaint  Patient presents with  . Chest Pain    Dana Cross is a 22 y.o. female with past medical history of anxiety and GERD presents with 2-day onset of nonradiating chest pain.  She states her pain "feels like someone is sitting on her chest and she cannot get a full breath."  Pain is constant without any aggravating or relieving factors.  She tried ibuprofen without any notable relief.  There is no exertional component.  She states she does feel a little intermittently lightheaded.  She states this feels different than her typical anxiety or acid reflux episodes.  Notes that she was up all last night due to the symptoms.  She does report that she recently traveled and was on a 3-hour flight.  Denies any calf pain.   Chest Pain Associated symptoms: no nausea and no shortness of breath        Home Medications Prior to Admission medications   Medication Sig Start Date End Date Taking? Authorizing Provider  amphetamine-dextroamphetamine (ADDERALL XR) 10 MG 24 hr capsule Take 1 capsule (10 mg total) by mouth every morning. 11/04/10 12/04/10  Young, Madaline Brilliant A, MD  amphetamine-dextroamphetamine (ADDERALL XR) 10 MG 24 hr capsule Take 1 capsule (10 mg total) by mouth every morning. 12/03/11   Young, Madaline Brilliant A, MD  amphetamine-dextroamphetamine (ADDERALL XR, 10MG ,) 10 MG 24 hr capsule Take 1 capsule (10 mg total) by mouth every morning. 09/19/10 10/19/10  Roni Bread A, MD  erythromycin ophthalmic ointment Apply as directed QID for 7 days or until resolved 07/02/11   Jessy Oto, NP  fluconazole (DIFLUCAN) 150 MG tablet Take 1 tablet (150 mg total) by mouth daily. Take one tablet now and one in 3 days if you are still having symptoms 09/19/20   Ivette Loyal, NP  montelukast  (SINGULAIR) 10 MG tablet Take 1 tablet (10 mg total) by mouth at bedtime. 01/26/19   Jeannie Fend, PA-C      Allergies    Cefdinir    Review of Systems   Review of Systems  Respiratory:  Positive for chest tightness. Negative for shortness of breath.   Cardiovascular:  Positive for chest pain.  Gastrointestinal:  Negative for constipation, diarrhea and nausea.    Physical Exam Updated Vital Signs BP (!) 132/103 (BP Location: Right Arm)   Pulse 94   Temp 98.3 F (36.8 C) (Oral)   Resp (!) 24   Ht 5\' 7"  (1.702 m)   Wt 63.5 kg   LMP 02/06/2023   SpO2 100%   BMI 21.93 kg/m  Physical Exam Vitals and nursing note reviewed.  Constitutional:      General: She is not in acute distress.    Appearance: She is well-developed.  HENT:     Head: Normocephalic and atraumatic.  Eyes:     Conjunctiva/sclera: Conjunctivae normal.  Cardiovascular:     Rate and Rhythm: Normal rate and regular rhythm.     Heart sounds: No murmur heard. Pulmonary:     Effort: Pulmonary effort is normal. No respiratory distress.     Breath sounds: Normal breath sounds. No decreased breath sounds, wheezing, rhonchi or rales.  Chest:     Chest wall: Tenderness present.  Abdominal:     Palpations: Abdomen is soft.  Comments: Epigastric tenderness  Musculoskeletal:        General: No swelling.     Cervical back: Neck supple.     Right lower leg: No tenderness. No edema.     Left lower leg: No tenderness. No edema.  Skin:    General: Skin is warm and dry.     Capillary Refill: Capillary refill takes less than 2 seconds.  Neurological:     General: No focal deficit present.     Mental Status: She is alert.  Psychiatric:        Mood and Affect: Mood normal.    ED Results / Procedures / Treatments   Labs (all labs ordered are listed, but only abnormal results are displayed) Labs Reviewed  BASIC METABOLIC PANEL - Abnormal; Notable for the following components:      Result Value   Glucose, Bld  106 (*)    All other components within normal limits  CBC  PREGNANCY, URINE  D-DIMER, QUANTITATIVE  TROPONIN I (HIGH SENSITIVITY)    EKG EKG Interpretation Date/Time:  Monday February 16 2023 16:35:58 EDT Ventricular Rate:  80 PR Interval:  140 QRS Duration:  74 QT Interval:  374 QTC Calculation: 431 R Axis:   62  Text Interpretation: Sinus rhythm with marked sinus arrhythmia Nonspecific ST abnormality Abnormal ECG When compared with ECG of 26-Jan-2019 19:11, T wave inversion no longer evident in Inferior leads Nonspecific T wave abnormality no longer evident in Anterolateral leads Confirmed by Anders Simmonds [phone]) on 02/16/2023 5:38:15 PM  Radiology No results found.  Procedures Procedures  {Document cardiac monitor, telemetry assessment procedure when appropriate:1}  Medications Ordered in ED Medications - No data to display  ED Course/ Medical Decision Making/ A&P Clinical Course as of 02/16/23 1947  Mon Feb 16, 2023  1946 D-dimer, quantitative Reassuring against PE/ DVT [JT]  1947 Troponin I (High Sensitivity) Reassuring against ACS [JT]  1947 Basic metabolic panel(!) Without significant abnormality [JT]  1947 CBC Unremarkable [JT]    Clinical Course User Index [JT] Halford Decamp, PA-C   {   Click here for ABCD2, HEART and other calculatorsREFRESH Note before signing :1}                              Medical Decision Making Geneive Whitson is a 22 y.o. female with past medical history of anxiety and GERD presents with 2-day onset of nonradiating chest pain.  Differential includes PE, ACS, AAA, pneumonia, esophageal rupture, GERD, anxiety.  Patient is accompanied by her roommate, prior notes and chart review utilized.  D-dimer, troponin, CBC, BMP all within normal limits and reassuring.  Chest x-ray without acute findings.  She is PERC negative.  Clinical exam history are additionally reassuring with chest wall tenderness, constant symptoms without exertional  component.  Overall I have low suspicion for PE, ACS or infectious process.  No cardiac monitoring, critical interventions, consultations indicated.  No social determinants of health.     Patient presents to the ED for concern of ***, this involves an extensive number of treatment options, and is a complaint that carries with it a high risk of complications and morbidity.  The differential diagnosis includes ***   Co morbidities that complicate the patient evaluation  ***   Additional history obtained:  Additional history obtained from *** {Blank multiple:19196::"EMS","Family","Nursing","Outside Medical Records","Past Admission"}   External records from outside source obtained and reviewed including ***   Lab Tests:  I Ordered, and personally interpreted labs.  The pertinent results include:  ***   Imaging Studies ordered:  I ordered imaging studies including ***  I independently visualized and interpreted imaging which showed *** I agree with the radiologist interpretation   Cardiac Monitoring:  The patient was maintained on a cardiac monitor.  I personally viewed and interpreted the cardiac monitored which showed an underlying rhythm of: ***   Medicines ordered and prescription drug management:  I ordered medication including ***  for ***  Reevaluation of the patient after these medicines showed that the patient {resolved/improved/worsened:23923::"improved"} I have reviewed the patients home medicines and have made adjustments as needed   Test Considered:  ***   Critical Interventions:  ***   Consultations Obtained:  I requested consultation with the ***,  and discussed lab and imaging findings as well as pertinent plan - they recommend: ***   Problem List / ED Course:  ***   Reevaluation:  After the interventions noted above, I reevaluated the patient and found that they have :{resolved/improved/worsened:23923::"improved"}   Social Determinants  of Health:  ***   Dispostion:  After consideration of the diagnostic results and the patients response to treatment, I feel that the patent would benefit from ***.    {Document critical care time when appropriate:1} {Document review of labs and clinical decision tools ie heart score, Chads2Vasc2 etc:1}  {Document your independent review of radiology images, and any outside records:1} {Document your discussion with family members, caretakers, and with consultants:1} {Document social determinants of health affecting pt's care:1} {Document your decision making why or why not admission, treatments were needed:1} Final Clinical Impression(s) / ED Diagnoses Final diagnoses:  None    Rx / DC Orders ED Discharge Orders     None

## 2023-12-08 ENCOUNTER — Ambulatory Visit: Payer: Self-pay

## 2023-12-08 NOTE — Telephone Encounter (Signed)
 Copied from CRM [id number]. Topic: Clinical - Red Word Triage >> Dec 08, 2023  4:49 PM [name] ORN wrote: Red Word that prompted transfer to Nurse Triage: bloating causing pain in her back/ tingling in her legs.  Getting worse in the past two weeks. Was calling to schedule as a new patient of Dr. Bernardino Cone. Reason for Disposition  [1] Pain radiates into the thigh or further down the leg AND [2] both legs    Chronic X 1 year. [facility]  Answer Assessment - Initial Assessment Questions 1. ONSET: When did the pain begin? (e.g., minutes, hours, days)     One year ago 2. LOCATION: Where does it hurt? (upper, mid or lower back)     Lower back 3. SEVERITY: How bad is the pain?  (e.g., Scale 1-10; mild, moderate, or severe)     varies 4. PATTERN: Is the pain constant? (e.g., yes, no; constant, intermittent)      intermittent 5. RADIATION: Does the pain shoot into your legs or somewhere else?     Bilateral legs 6. CAUSE:  What do you think is causing the back pain?      Unsure-has been to urgent care but not helpful 7. BACK OVERUSE:  Any recent lifting of heavy objects, strenuous work or exercise?     No 8. MEDICINES: What have you taken so far for the pain? (e.g., nothing, acetaminophen, NSAIDS)      9. NEUROLOGIC SYMPTOMS: Do you have any weakness, numbness, or problems with bowel/bladder control?     Occasional infrequent bowel movements 10. OTHER SYMPTOMS: Do you have any other symptoms? (e.g., fever, abdomen pain, burning with urination, blood in urine)       Each morning has a lot of gas with bloating that subsides with passing gas.  Additional info: Patient has had her symptoms intermittently X 1 year, she has been evaluated at urgent care but treatment was not helpful, she was advised to contact pcp for further evaluation and management. New patient appointment scheduled with preferred provider, advised urgent care for worsening chronic  condition.  Protocols used: Back Pain-A-AH

## 2024-01-19 ENCOUNTER — Ambulatory Visit: Admitting: Internal Medicine

## 2024-04-27 ENCOUNTER — Other Ambulatory Visit: Payer: Self-pay | Admitting: Obstetrics and Gynecology

## 2024-05-11 NOTE — H&P (Signed)
 Dana Cross is a 24 y.o. female G: 0 presents for Robot Assisted Resection of endometriosis due to chronic pelvic pain. Patient has a long history of severe debilitating dysmenorrhea characterized by  severe pain, vomiting , bowel dysfunction, urinary urgency and syncope with her periods.  Over the years she was given combination oral contraceptives for management but none of the #3 tried yielded  relief. There were times when her menstrual flow would last 12 days each month accompanied by heavy bleeding and the other discomforts described above. Eventually her pelvic pain occurred without menses and included deep dyspareunia. The patient was placed on Lupron Depot 11.25 mg  in June 2025  with miraculous results-no pain or abnormal bleeding.  The exception to this is when she doesn't take her add-back therapy  (Abigale 1 mg/0.5) she will experience some cramping in addition to vasomotor symptoms.   Other symptoms experienced since being on Lupron Depot include a 30 lb weight gain, and feeling bloated.  A pelvic ultrasound June 10, 2023 revealed a complete septate uterus-volume-71 cc,  6.42 x 5.96 x 3.73 cm;  endometrium-12.36 mm and right ovary-3.95 cm /left ovary-3.05 cm.  Given the positive response to GnRh agonists and related breakthrough symptoms the  patient would like obtain a confirmation of endometriosis diagnosis and resolve residual pelvic pain and dyspareunia through surgical management. Past Medical History  OB History: G:0  GYN History: menarche: 24 yo;  LMP : 05/10/2023;    Contraception: oral contraceptives (estrogen/progesterone);   Denies history of abnormal PAP smear;  Last PAP smear: 2025 normal  Medical History: ADHD and Septate Uterus  Surgical History: 2008: Tonsillectomy Denies problems with anesthesia or history of blood transfusions  Family History: Lynch Syndrome, Colon Cancer, Breast Cancer  Social History:  Single and Employed by [employer];  Denies tobacco use and  occasionally consumes alcohol   Medications: Lupron Depot 11.25 mg IM (last injection 03/07/2024) Abigale 1 mg/0.5 mg daily Buproprion 150 mg XL daily Escitalopram 10 mg daily   Allergies[1] Cephalosporins  (hives)  ROS: Admits to glasses but denies headache, vision changes, nasal congestion, dysphagia, tinnitus, dizziness, hoarseness, cough,  chest pain, shortness of breath, nausea, vomiting, diarrhea,constipation,  urinary frequency, urgency  dysuria, hematuria, vaginitis symptoms, pelvic pain, swelling of joints,easy bruising,  myalgias, arthralgias, skin rashes, unexplained weight loss and except as is mentioned in the history of present illness, patient's review of systems is otherwise negative.    Physical Exam  Bp: 122/80;  Weight: 170.2 lbs.;  Height: 5'7;  BMI: 26.7  Neck: supple without masses or thyromegaly Lungs: clear to auscultation Heart: regular rate and rhythm Abdomen: soft, non-tender and no organomegaly Pelvic:EGBUS- wnl; vagina-normal rugae; uterus-normal size, cervix without lesions or motion tenderness; adnexae-no tenderness or masses Extremities:  no clubbing, cyanosis or edema   Assesment: Chronic Pelvic Pain                      Abnormal Uterine Bleeding                     Suspected Endometriosis   Disposition:  The patient was given the indication for her procedures along with the risks and benefits. A Robot Assisted Laparoscopic benefits include lesser postoperative pain, less blood loss during surgery, reduced risk of injury to other organs due to better visualization with a 3-D HD 10 times magnifying camera, shorter hospital stay between 0-1 night and rapid recovery with return to daily routine in 2-3 weeks.  Although this robot-assisted procedure has a longer operative time than traditional laparotomy, in a patient with good medical history, the benefits usually outweigh the risks.  Risks include but are not limited to bleeding, infection, injury to  other organs (bladder, bowel, ureters, tubes), need for laparotomy, and transient post-operative facial edema. A Miralax Bowel Prep was given to the patient to complete the day before her surgery. She verbalized understanding of these risks and has consented to proceed with a Robot Assisted Laparoscopic Resection of endometriosis on May 27, 2023 at [facility]  CSN# [id number]   Sabriah Hobbins J. Perri, PA-C  for Dr. Nena LABOR. [place]     [1]  Allergies Allergen Reactions   Cefdinir Other (See Comments)    Serum sickness ; hives - loss feeling in legs

## 2024-05-24 ENCOUNTER — Encounter (HOSPITAL_COMMUNITY): Payer: Self-pay | Admitting: Obstetrics and Gynecology

## 2024-05-26 ENCOUNTER — Ambulatory Visit (HOSPITAL_COMMUNITY)
Admission: RE | Admit: 2024-05-26 | Discharge: 2024-05-26 | Disposition: A | Payer: Self-pay | Source: Home / Self Care | Attending: Obstetrics and Gynecology | Admitting: Obstetrics and Gynecology

## 2024-05-26 ENCOUNTER — Encounter (HOSPITAL_COMMUNITY): Admitting: Certified Registered Nurse Anesthetist

## 2024-05-26 ENCOUNTER — Other Ambulatory Visit: Payer: Self-pay

## 2024-05-26 ENCOUNTER — Encounter (HOSPITAL_COMMUNITY): Payer: Self-pay | Admitting: Obstetrics and Gynecology

## 2024-05-26 ENCOUNTER — Encounter: Admission: RE | Disposition: A | Payer: Self-pay | Source: Home / Self Care | Attending: Obstetrics and Gynecology

## 2024-05-26 DIAGNOSIS — Z01818 Encounter for other preprocedural examination: Secondary | ICD-10-CM

## 2024-05-26 HISTORY — DX: Abnormal uterine and vaginal bleeding, unspecified: N93.9

## 2024-05-26 HISTORY — DX: Unspecified dyspareunia: N94.10

## 2024-05-26 HISTORY — DX: Pelvic and perineal pain unspecified side: R10.20

## 2024-05-26 LAB — CBC
HCT: 46.4 % — ABNORMAL HIGH (ref 36.0–46.0)
Hemoglobin: 15.9 g/dL — ABNORMAL HIGH (ref 12.0–15.0)
MCH: 31.6 pg (ref 26.0–34.0)
MCHC: 34.3 g/dL (ref 30.0–36.0)
MCV: 92.2 fL (ref 80.0–100.0)
Platelets: 207 10*3/uL (ref 150–400)
RBC: 5.03 MIL/uL (ref 3.87–5.11)
RDW: 11.7 % (ref 11.5–15.5)
WBC: 4.6 10*3/uL (ref 4.0–10.5)
nRBC: 0 % (ref 0.0–0.2)

## 2024-05-26 LAB — TYPE AND SCREEN
ABO/RH(D): O NEG
Antibody Screen: NEGATIVE

## 2024-05-26 LAB — POCT PREGNANCY, URINE: Preg Test, Ur: NEGATIVE

## 2024-05-26 LAB — ABO/RH: ABO/RH(D): O NEG

## 2024-05-26 MED ORDER — CIPROFLOXACIN IN D5W 400 MG/200ML IV SOLN
INTRAVENOUS | Status: AC
  Filled 2024-05-26: qty 200

## 2024-05-26 MED ORDER — ROCURONIUM BROMIDE 100 MG/10ML IV SOLN
INTRAVENOUS | Status: DC | PRN
  Administered 2024-05-26 (×2): 30 mg via INTRAVENOUS
  Administered 2024-05-26: 20 mg via INTRAVENOUS
  Administered 2024-05-26: 50 mg via INTRAVENOUS
  Administered 2024-05-26: 20 mg via INTRAVENOUS

## 2024-05-26 MED ORDER — ROCURONIUM BROMIDE 10 MG/ML (PF) SYRINGE
PREFILLED_SYRINGE | INTRAVENOUS | Status: AC
  Filled 2024-05-26: qty 10

## 2024-05-26 MED ORDER — CELECOXIB 200 MG PO CAPS
ORAL_CAPSULE | ORAL | Status: AC
  Filled 2024-05-26: qty 2

## 2024-05-26 MED ORDER — SODIUM CHLORIDE (PF) 0.9 % IJ SOLN
INTRAMUSCULAR | Status: DC | PRN
  Administered 2024-05-26: 120 mL via SURGICAL_CAVITY

## 2024-05-26 MED ORDER — ONDANSETRON HCL 4 MG/2ML IJ SOLN
INTRAMUSCULAR | Status: AC
  Filled 2024-05-26: qty 2

## 2024-05-26 MED ORDER — ENSURE PRE-SURGERY PO LIQD: 592.0000 mL | Freq: Once | ORAL | Status: DC

## 2024-05-26 MED ORDER — POVIDONE-IODINE 10 % EX SWAB: 2.0000 | Freq: Once | CUTANEOUS | Status: DC

## 2024-05-26 MED ORDER — KETAMINE HCL 50 MG/5ML IJ SOSY
PREFILLED_SYRINGE | INTRAMUSCULAR | Status: AC
  Filled 2024-05-26: qty 5

## 2024-05-26 MED ORDER — SODIUM CHLORIDE (PF) 0.9 % IJ SOLN
INTRAMUSCULAR | Status: AC
  Filled 2024-05-26: qty 50

## 2024-05-26 MED ORDER — MIDAZOLAM HCL (PF) 2 MG/2ML IJ SOLN
INTRAMUSCULAR | Status: DC | PRN
  Administered 2024-05-26: 2 mg via INTRAVENOUS

## 2024-05-26 MED ORDER — SCOPOLAMINE 1 MG/3DAYS TD PT72
1.0000 | MEDICATED_PATCH | TRANSDERMAL | Status: DC
  Administered 2024-05-26: 1 mg via TRANSDERMAL

## 2024-05-26 MED ORDER — METHYLENE BLUE 20 MG/2ML IV SOSY
PREFILLED_SYRINGE | INTRAVENOUS | Status: AC
  Filled 2024-05-26: qty 2

## 2024-05-26 MED ORDER — MIDAZOLAM HCL 2 MG/2ML IJ SOLN
INTRAMUSCULAR | Status: AC
  Filled 2024-05-26: qty 2

## 2024-05-26 MED ORDER — CLINDAMYCIN PHOSPHATE 900 MG/50ML IV SOLN
INTRAVENOUS | Status: AC
  Filled 2024-05-26: qty 50

## 2024-05-26 MED ORDER — LIDOCAINE 2% (20 MG/ML) 5 ML SYRINGE
INTRAMUSCULAR | Status: AC
  Filled 2024-05-26: qty 5

## 2024-05-26 MED ORDER — ROPIVACAINE HCL 5 MG/ML IJ SOLN
INTRAMUSCULAR | Status: AC
  Filled 2024-05-26: qty 60

## 2024-05-26 MED ORDER — GABAPENTIN 300 MG PO CAPS
ORAL_CAPSULE | ORAL | Status: AC
  Filled 2024-05-26: qty 1

## 2024-05-26 MED ORDER — ACETAMINOPHEN 500 MG PO TABS
ORAL_TABLET | ORAL | Status: AC
  Filled 2024-05-26: qty 2

## 2024-05-26 MED ORDER — FENTANYL CITRATE (PF) 250 MCG/5ML IJ SOLN
INTRAMUSCULAR | Status: AC
  Filled 2024-05-26: qty 5

## 2024-05-26 MED ORDER — DEXMEDETOMIDINE HCL IN NACL 80 MCG/20ML IV SOLN
INTRAVENOUS | Status: AC
  Filled 2024-05-26: qty 20

## 2024-05-26 MED ORDER — PROPOFOL 1000 MG/100ML IV EMUL
INTRAVENOUS | Status: AC
  Filled 2024-05-26: qty 400

## 2024-05-26 MED ORDER — LIDOCAINE 2% (20 MG/ML) 5 ML SYRINGE
INTRAMUSCULAR | Status: DC | PRN
  Administered 2024-05-26: 40 mg via INTRAVENOUS

## 2024-05-26 MED ORDER — ACETAMINOPHEN 10 MG/ML IV SOLN: 1000.0000 mg | Freq: Once | INTRAVENOUS | Status: DC | PRN

## 2024-05-26 MED ORDER — PROPOFOL 500 MG/50ML IV EMUL
INTRAVENOUS | Status: DC | PRN
  Administered 2024-05-26: 200 mg via INTRAVENOUS
  Administered 2024-05-26: 200 ug/kg/min via INTRAVENOUS

## 2024-05-26 MED ORDER — ONDANSETRON HCL 4 MG/2ML IJ SOLN
INTRAMUSCULAR | Status: DC | PRN
  Administered 2024-05-26: 4 mg via INTRAVENOUS

## 2024-05-26 MED ORDER — OXYCODONE HCL 5 MG/5ML PO SOLN: 5.0000 mg | Freq: Once | ORAL | Status: AC | PRN

## 2024-05-26 MED ORDER — PHENYLEPHRINE HCL-NACL 20-0.9 MG/250ML-% IV SOLN
INTRAVENOUS | Status: DC | PRN
  Administered 2024-05-26: 10 ug/min via INTRAVENOUS

## 2024-05-26 MED ORDER — KETAMINE HCL 50 MG/5ML IJ SOSY
PREFILLED_SYRINGE | INTRAMUSCULAR | Status: DC | PRN
  Administered 2024-05-26: 30 mg via INTRAVENOUS

## 2024-05-26 MED ORDER — FENTANYL CITRATE (PF) 250 MCG/5ML IJ SOLN
INTRAMUSCULAR | Status: DC | PRN
  Administered 2024-05-26 (×2): 50 ug via INTRAVENOUS
  Administered 2024-05-26: 100 ug via INTRAVENOUS

## 2024-05-26 MED ORDER — FENTANYL CITRATE (PF) 100 MCG/2ML IJ SOLN: 25.0000 ug | INTRAMUSCULAR | Status: DC | PRN

## 2024-05-26 MED ORDER — 0.9 % SODIUM CHLORIDE (POUR BTL) OPTIME
TOPICAL | Status: DC | PRN
  Administered 2024-05-26: 1000 mL

## 2024-05-26 MED ORDER — CHLORHEXIDINE GLUCONATE 0.12 % MT SOLN
OROMUCOSAL | Status: AC
  Filled 2024-05-26: qty 15

## 2024-05-26 MED ORDER — SODIUM CHLORIDE 0.9 % IR SOLN
Status: DC | PRN
  Administered 2024-05-26: 1000 mL

## 2024-05-26 MED ORDER — CELECOXIB 200 MG PO CAPS
400.0000 mg | ORAL_CAPSULE | ORAL | Status: AC
  Administered 2024-05-26: 400 mg via ORAL

## 2024-05-26 MED ORDER — DEXAMETHASONE SOD PHOSPHATE PF 10 MG/ML IJ SOLN
INTRAMUSCULAR | Status: DC | PRN
  Administered 2024-05-26: 10 mg via INTRAVENOUS

## 2024-05-26 MED ORDER — CLINDAMYCIN PHOSPHATE 900 MG/50ML IV SOLN: 900.0000 mg | INTRAVENOUS | Status: DC

## 2024-05-26 MED ORDER — CELECOXIB 200 MG PO CAPS
200.0000 mg | ORAL_CAPSULE | Freq: Once | ORAL | Status: DC
  Filled 2024-05-26: qty 1

## 2024-05-26 MED ORDER — DEXAMETHASONE SOD PHOSPHATE PF 10 MG/ML IJ SOLN
INTRAMUSCULAR | Status: AC
  Filled 2024-05-26: qty 1

## 2024-05-26 MED ORDER — GABAPENTIN 300 MG PO CAPS
300.0000 mg | ORAL_CAPSULE | ORAL | Status: AC
  Administered 2024-05-26: 300 mg via ORAL

## 2024-05-26 MED ORDER — OXYCODONE HCL 5 MG PO TABS
5.0000 mg | ORAL_TABLET | Freq: Once | ORAL | Status: AC | PRN
  Administered 2024-05-26: 5 mg via ORAL

## 2024-05-26 MED ORDER — CHLORHEXIDINE GLUCONATE 0.12 % MT SOLN
15.0000 mL | Freq: Once | OROMUCOSAL | Status: AC
  Administered 2024-05-26: 15 mL via OROMUCOSAL

## 2024-05-26 MED ORDER — OXYCODONE HCL 5 MG PO TABS
ORAL_TABLET | ORAL | Status: AC
  Filled 2024-05-26: qty 1

## 2024-05-26 MED ORDER — SCOPOLAMINE 1 MG/3DAYS TD PT72
MEDICATED_PATCH | TRANSDERMAL | Status: AC
  Filled 2024-05-26: qty 1

## 2024-05-26 MED ORDER — ACETAMINOPHEN 500 MG PO TABS
1000.0000 mg | ORAL_TABLET | ORAL | Status: AC
  Administered 2024-05-26: 1000 mg via ORAL

## 2024-05-26 MED ORDER — CIPROFLOXACIN IN D5W 400 MG/200ML IV SOLN: 400.0000 mg | INTRAVENOUS | Status: DC

## 2024-05-26 MED ORDER — DEXMEDETOMIDINE HCL IN NACL 80 MCG/20ML IV SOLN
INTRAVENOUS | Status: DC | PRN
  Administered 2024-05-26: 12 ug via INTRAVENOUS

## 2024-05-26 MED ORDER — LACTATED RINGERS IV SOLN: INTRAVENOUS | Status: DC

## 2024-05-26 MED ORDER — SUGAMMADEX SODIUM 200 MG/2ML IV SOLN
INTRAVENOUS | Status: DC | PRN
  Administered 2024-05-26: 149.6 mg via INTRAVENOUS

## 2024-05-26 MED ORDER — SODIUM CHLORIDE (PF) 0.9 % IJ SOLN
INTRAMUSCULAR | Status: AC
  Filled 2024-05-26: qty 10

## 2024-05-26 MED ORDER — PROPOFOL 10 MG/ML IV BOLUS
INTRAVENOUS | Status: AC
  Filled 2024-05-26: qty 20

## 2024-05-26 MED ORDER — ORAL CARE MOUTH RINSE: 15.0000 mL | Freq: Once | OROMUCOSAL | Status: AC

## 2024-05-26 MED ORDER — ENSURE PRE-SURGERY PO LIQD: 296.0000 mL | Freq: Once | ORAL | Status: DC

## 2024-05-26 MED ORDER — GLYCOPYRROLATE 0.2 MG/ML IJ SOLN
INTRAMUSCULAR | Status: DC | PRN
  Administered 2024-05-26: .2 mg via INTRAVENOUS

## 2024-05-26 NOTE — Discharge Instructions (Addendum)
 Call [facility] at [phone] for :  Post operative appointment time and date if it is not included in your post operative information.  Temperature greater than or equal to 100.4 degrees Farenheit orally Excessive pain not managed with your pain medications Excessive bleeding, problems urinating or other concerns  Take Ibuprofen 600 mg with food along with Acetaminophen  #2-500 mg tablets every 6 hours x 3 days then as needed for pain  While taking pain medications, take Colace (Docusate Sodium) 100 mg 2-3 times daily until bowel movements are regular to prevent constipation.  You may shower tomorrow You may walk up stairs  You may drive after 36 hours You may resume a regular diet  You should not lift over 15 pounds for 6 weeks You should not place anything in your vagina until after your post operative visit.     Post Anesthesia Home Care Instructions  Activity: Get plenty of rest for the remainder of the day. A responsible adult should stay with you for 24 hours following the procedure.  For the next 24 hours, DO NOT: -Drive a car -Advertising copywriter -Drink alcoholic beverages -Take any medication unless instructed by your physician -Make any legal decisions or sign important papers.  Meals: Start with liquid foods such as gelatin or soup. Progress to regular foods as tolerated. Avoid greasy, spicy, heavy foods. If nausea and/or vomiting occur, drink only clear liquids until the nausea and/or vomiting subsides. Call your physician if vomiting continues.  Special Instructions/Symptoms: Your throat may feel dry or sore from the anesthesia or the breathing tube placed in your throat during surgery. If this causes discomfort, gargle with warm salt water. The discomfort should disappear within 24 hours.  If you had a scopolamine  patch placed behind your ear for the management of post- operative nausea and/or vomiting:  1. The medication in the patch is effective for  72 hours, after which it should be removed.  Wrap patch in a tissue and discard in the trash. Wash hands thoroughly with soap and water. 2. You may remove the patch earlier than 72 hours if you experience unpleasant side effects which may include dry mouth, dizziness or visual disturbances. 3. Avoid touching the patch. Wash your hands with soap and water after contact with the patch.

## 2024-05-26 NOTE — Anesthesia Preprocedure Evaluation (Signed)
 "                                  Anesthesia Evaluation  Patient identified by MRN, date of birth, ID band Patient awake    Reviewed: Allergy & Precautions, NPO status , Patient's Chart, lab work & pertinent test results  History of Anesthesia Complications Negative for: history of anesthetic complications  Airway Mallampati: II  TM Distance: >3 FB Neck ROM: Full    Dental  (+) Teeth Intact, Dental Advisory Given   Pulmonary neg shortness of breath, neg COPD, neg recent URI   breath sounds clear to auscultation       Cardiovascular negative cardio ROS  Rhythm:Regular     Neuro/Psych neg Seizures    GI/Hepatic negative GI ROS, Neg liver ROS,,,  Endo/Other  negative endocrine ROS    Renal/GU negative Renal ROS     Musculoskeletal negative musculoskeletal ROS (+)    Abdominal   Peds  Hematology negative hematology ROS (+) Lab Results      Component                Value               Date                      WBC                      4.6                 05/26/2024                HGB                      15.9 (H)            05/26/2024                HCT                      46.4 (H)            05/26/2024                MCV                      92.2                05/26/2024                PLT                      207                 05/26/2024              Anesthesia Other Findings   Reproductive/Obstetrics Lab Results      Component                Value               Date                      PREGTESTUR               NEGATIVE  05/26/2024                HCG                      <5.0                01/26/2019                                         Anesthesia Physical Anesthesia Plan  ASA: 1  Anesthesia Plan:    Post-op Pain Management: Tylenol  PO (pre-op)*, Gabapentin  PO (pre-op)*, Celebrex  PO (pre-op)*, Ketamine  IV* and Precedex    Induction: Intravenous  PONV Risk Score and Plan: 3 and Ondansetron ,  Dexamethasone , Propofol  infusion, TIVA and Midazolam   Airway Management Planned: Oral ETT  Additional Equipment: None  Intra-op Plan:   Post-operative Plan: Extubation in OR  Informed Consent: I have reviewed the patients History and Physical, chart, labs and discussed the procedure including the risks, benefits and alternatives for the proposed anesthesia with the patient or authorized representative who has indicated his/her understanding and acceptance.     Dental advisory given  Plan Discussed with: CRNA  Anesthesia Plan Comments:         Anesthesia Quick Evaluation  "

## 2024-05-26 NOTE — Op Note (Signed)
 Preoperative diagnosis: Chronic pelvic pain  Postoperative diagnosis: Stage 2 endometriosis with pelvic adhesions  Anesthesia: Gen.  Anesthesiologist: Dr. Leopoldo  Procedure: Robotic assisted diagnostic laparoscopy with excision of endometriosis and lysis of adhesions. Chromopertubation  Surgeon: Dr. Nena Shanley Furlough  Assistant: Madolyn Monte PA-C  Estimated blood loss: Minimal  Procedure:  After being informed of the planned procedure with possible complications including bleeding, infection, injury to other organs,informed consent is obtained and the patient is taken to OR # 6. She is placed in lithotomy position, prepped and draped in a sterile fashion, and her bladder is emptied with an in and out red rubber catheter.  Pelvic exam: Normal external genitalia, normal cervix, normal uterus, normal adnexa  A speculum is inserted in the vagina and the anterior lip of the cervix was grasped with tenaculum forcep.  We performed a paracervical block using 10 cc of ropivacaine  0.25%.  The uterus is sounded at 8 cm.  A Rumi intrauterine manipulator is easily positioned, appears to be on the left side of the uterus and the speculum is removed.  We infiltrate the umbilical area using 10 cc of Marcaine 0.25 and perform a semi-elliptical incision which is brought down bluntly to the fascia. The fascia is identified and grasped with Coker forceps. The fascia is opened with Mayo scissors. Peritoneum is entered bluntly. A pursestring suture of 0 Vicryl is placed on the fascia. A 10 mm Hassan trocar is easily inserted in the abdominal cavity and is held in place both by the intrauterine balloon and the previously placed pursestring suture. This allows for easy insufflation of the pneumoperitoneum using warm CO2 at a maximum pressure of 15 mmHg.  We placed 3 more trocars as follows: One 8 mm robotic trocar on the left one 8 mm robotic trocar on the right and a 5 mm patient's side assistant trocar on the right.   The robot is docked on the patient's left side preparation and docking is completed and 50 minutes.  Observation: Anterior cul-de-sac is a small cluster of dark 1 to 2 mm endometriotic lesions is seen on the left anterior cul-de-sac for an area of 3 cm x 3 cm, posterior cul-de-sac also shows a cluster of dark endometriotic lesions as well as scattered red lesions from the left utero sacral ligament to the left paraovarian fossa for a total area of 6 cm x 3 cm.there is also clear lesions in the posterior cul-de-sac between the 2 uterosacral ligaments measuring 2 mm.  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 Both tubes and ovaries are removed. Appendix is visualized not visualized. Liver is visualized and normal. Gallbladder is visualized and normal.  There are grade 1 and 2 adhesions between the sigmoid colon and the left pelvic wall.  Using monopolar and bipolar energy we proceed with systematic resection and excision of peritoneal surfaces in the area as previously described mainly in the posterior cul-de-sac as well as anterior cul-de-sac.  A small left paratubal cyst measuring 1 cm is excised.  We also proceeded with sharp lysis of adhesion to free the sigmoid colon.  Both ureters were easily visualized and away from our site of excision.  We confirm satisfactory hemostasis.  50 cc of ropivacaine  0.25% is left in the posterior cul-de-sac.  Console time is 2 hours.  All instruments are then removed, the robot is undocked and  the pneumoperitoneum is evacuated. The fascia of the umbilical incision is closed with the purse string suture of 0  Vicryl. The skin of all incisions is closed with a subcuticular suture of 3-0 Monocryl and Dermabond.  Instrument and sponge count is complete x2. Estimated blood loss is minimal.The procedure is well tolerated by the patient is taken to recovery room in a well and stable condition.    Specimen: Left ovarian  fossa peritoneum, posterior cul-de-sac peritoneum, left uterosacral ligament peritoneum, left paratubal cyst.  DARCEL POOL A  MD

## 2024-05-26 NOTE — Interval H&P Note (Signed)
 History and Physical Interval Note:  05/26/2024 10:53 AM  Dana Cross  has presented today for surgery, with the diagnosis of chronic pelvic pain, dyspareunia.  The various methods of treatment have been discussed with the patient and family. After consideration of risks, benefits and other options for treatment, the patient has consented to  Procedures: EXCISION, ENDOMETRIOSIS, ROBOTIC ASSISTED, LAPAROSCOPIC (N/A) as a surgical intervention.  The patient's history has been reviewed, patient examined, no change in status, stable for surgery.  I have reviewed the patient's chart and labs.  Questions were answered to the patient's satisfaction.     Nena A Grayland Daisey

## 2024-05-26 NOTE — Anesthesia Procedure Notes (Signed)
 Procedure Name: Intubation Date/Time: 05/26/2024 11:38 AM  Performed by: Harrold Macintosh, CRNAPre-anesthesia Checklist: Patient identified, Emergency Drugs available, Suction available and Patient being monitored Patient Re-evaluated:Patient Re-evaluated prior to induction Oxygen Delivery Method: Circle system utilized Preoxygenation: Pre-oxygenation with 100% oxygen Induction Type: IV induction Ventilation: Mask ventilation without difficulty Laryngoscope Size: Miller and 2 Grade View: Grade I Tube type: Oral Tube size: 6.5 mm Number of attempts: 1 Airway Equipment and Method: Stylet and Bite block Placement Confirmation: ETT inserted through vocal cords under direct vision, positive ETCO2 and breath sounds checked- equal and bilateral Secured at: 21 cm Tube secured with: Tape Dental Injury: Teeth and Oropharynx as per pre-operative assessment

## 2024-05-26 NOTE — Transfer of Care (Signed)
 Immediate Anesthesia Transfer of Care Note  Patient: Dana Cross  Procedure(s) Performed: EXCISION, ENDOMETRIOSIS, ROBOTIC ASSISTED, LAPAROSCOPIC, REMOVAL LEFT PARATUBAL CYST<LYSIS OF ADHESIONS  Patient Location: PACU  Anesthesia Type:General  Level of Consciousness: awake, alert , and oriented  Airway & Oxygen Therapy: Patient Spontanous Breathing  Post-op Assessment: Report given to RN, Post -op Vital signs reviewed and stable, Patient moving all extremities X 4, and Patient able to stick tongue midline  Post vital signs: Reviewed and stable  Last Vitals:  Vitals Value Taken Time  BP 100/72 05/26/24 14:24  Temp 36.8 C 05/26/24 14:24  Pulse 68 05/26/24 14:26  Resp 21 05/26/24 14:26  SpO2 94 % 05/26/24 14:26  Vitals shown include unfiled device data.  Last Pain:  Vitals:   05/26/24 0927  TempSrc: Oral  PainSc: 0-No pain      Patients Stated Pain Goal: 6 (05/26/24 0927)  Complications: No notable events documented.

## 2024-05-27 ENCOUNTER — Encounter (HOSPITAL_COMMUNITY): Payer: Self-pay | Admitting: Obstetrics and Gynecology

## 2024-05-27 LAB — SURGICAL PATHOLOGY
